# Patient Record
Sex: Male | Born: 1940 | Race: White | Hispanic: No | Marital: Married | Smoking: Current every day smoker
Health system: Southern US, Community
[De-identification: ages and names within clinical notes are randomized; demographics above are authoritative.]

## PROBLEM LIST (undated history)

## (undated) DIAGNOSIS — Z972 Presence of dental prosthetic device (complete) (partial): Secondary | ICD-10-CM

## (undated) DIAGNOSIS — I10 Essential (primary) hypertension: Secondary | ICD-10-CM

## (undated) DIAGNOSIS — K219 Gastro-esophageal reflux disease without esophagitis: Secondary | ICD-10-CM

## (undated) DIAGNOSIS — I509 Heart failure, unspecified: Secondary | ICD-10-CM

## (undated) DIAGNOSIS — I714 Abdominal aortic aneurysm, without rupture, unspecified: Secondary | ICD-10-CM

## (undated) DIAGNOSIS — I739 Peripheral vascular disease, unspecified: Secondary | ICD-10-CM

## (undated) DIAGNOSIS — E785 Hyperlipidemia, unspecified: Secondary | ICD-10-CM

---

## 1992-06-16 HISTORY — PX: CERVICAL FUSION: SHX112

## 2008-06-16 HISTORY — PX: CAROTID ENDARTERECTOMY: SUR193

## 2010-08-28 DIAGNOSIS — I639 Cerebral infarction, unspecified: Secondary | ICD-10-CM

## 2010-08-28 HISTORY — DX: Cerebral infarction, unspecified: I63.9

## 2018-05-16 DIAGNOSIS — I219 Acute myocardial infarction, unspecified: Secondary | ICD-10-CM

## 2018-05-16 HISTORY — DX: Acute myocardial infarction, unspecified: I21.9

## 2018-05-27 HISTORY — PX: CORONARY ARTERY BYPASS GRAFT: SHX141

## 2020-01-26 ENCOUNTER — Other Ambulatory Visit: Payer: Self-pay | Admitting: Orthopedic Surgery

## 2020-01-26 DIAGNOSIS — M5136 Other intervertebral disc degeneration, lumbar region: Secondary | ICD-10-CM

## 2020-01-26 DIAGNOSIS — G8929 Other chronic pain: Secondary | ICD-10-CM

## 2020-02-14 ENCOUNTER — Ambulatory Visit: Payer: Self-pay

## 2020-02-27 ENCOUNTER — Other Ambulatory Visit: Payer: Self-pay

## 2020-02-27 ENCOUNTER — Ambulatory Visit
Admission: RE | Admit: 2020-02-27 | Discharge: 2020-02-27 | Disposition: A | Discharge status: Home / Self Care | Payer: Federal, State, Local not specified - PPO | Source: Ambulatory Visit | Attending: Orthopedic Surgery | Admitting: Orthopedic Surgery

## 2020-02-27 DIAGNOSIS — M5441 Lumbago with sciatica, right side: Secondary | ICD-10-CM | POA: Insufficient documentation

## 2020-02-27 DIAGNOSIS — M5136 Other intervertebral disc degeneration, lumbar region: Secondary | ICD-10-CM | POA: Diagnosis present

## 2020-02-27 DIAGNOSIS — G8929 Other chronic pain: Secondary | ICD-10-CM | POA: Diagnosis present

## 2020-05-29 ENCOUNTER — Encounter: Payer: Self-pay | Admitting: Ophthalmology

## 2020-05-29 ENCOUNTER — Other Ambulatory Visit: Payer: Self-pay

## 2020-05-30 NOTE — Anesthesia Preprocedure Evaluation (Addendum)
Anesthesia Evaluation  Patient identified by MRN, date of birth, ID band Patient awake    Reviewed: Allergy & Precautions, NPO status , Patient's Chart, lab work & pertinent test results, reviewed documented beta blocker date and time   Airway Mallampati: II  TM Distance: >3 FB Neck ROM: Full    Dental  (+) Upper Dentures, Lower Dentures   Pulmonary Current Smoker and Patient abstained from smoking.,    Pulmonary exam normal        Cardiovascular hypertension, Pt. on medications and Pt. on home beta blockers + Past MI (NSTEMI 2019), + CABG (3v 2019, afib post-op but resolved prior to discharge) and + Peripheral Vascular Disease (s/P L CEA)  Normal cardiovascular exam  H/o carotid endarterectomy 2010  H/o of AAA   Neuro/Psych CVA, No Residual Symptoms negative psych ROS   GI/Hepatic Neg liver ROS, GERD  Controlled,  Endo/Other  negative endocrine ROSneg diabetes  Renal/GU negative Renal ROS     Musculoskeletal negative musculoskeletal ROS (+)   Abdominal Normal abdominal exam  (+)   Peds  Hematology negative hematology ROS (+)   Anesthesia Other Findings   Reproductive/Obstetrics                           Anesthesia Physical Anesthesia Plan  ASA: III  Anesthesia Plan: MAC   Post-op Pain Management:    Induction: Intravenous  PONV Risk Score and Plan: 0 and TIVA, Midazolam and Treatment may vary due to age or medical condition  Airway Management Planned: Natural Airway and Nasal Cannula  Additional Equipment:   Intra-op Plan:   Post-operative Plan:   Informed Consent: I have reviewed the patients History and Physical, chart, labs and discussed the procedure including the risks, benefits and alternatives for the proposed anesthesia with the patient or authorized representative who has indicated his/her understanding and acceptance.       Plan Discussed with: CRNA  Anesthesia  Plan Comments:         Anesthesia Quick Evaluation

## 2020-05-31 ENCOUNTER — Other Ambulatory Visit
Admission: RE | Admit: 2020-05-31 | Discharge: 2020-05-31 | Disposition: A | Discharge status: Home / Self Care | Payer: Federal, State, Local not specified - PPO | Source: Ambulatory Visit | Attending: Ophthalmology | Admitting: Ophthalmology

## 2020-05-31 ENCOUNTER — Other Ambulatory Visit: Payer: Self-pay

## 2020-05-31 DIAGNOSIS — Z01812 Encounter for preprocedural laboratory examination: Secondary | ICD-10-CM | POA: Insufficient documentation

## 2020-05-31 DIAGNOSIS — Z20822 Contact with and (suspected) exposure to covid-19: Secondary | ICD-10-CM | POA: Insufficient documentation

## 2020-05-31 LAB — SARS CORONAVIRUS 2 (TAT 6-24 HRS): SARS Coronavirus 2: NEGATIVE

## 2020-05-31 NOTE — Discharge Instructions (Signed)

## 2020-06-04 ENCOUNTER — Other Ambulatory Visit: Payer: Self-pay

## 2020-06-04 ENCOUNTER — Encounter: Payer: Self-pay | Admitting: Ophthalmology

## 2020-06-04 ENCOUNTER — Ambulatory Visit: Payer: Federal, State, Local not specified - PPO | Admitting: Anesthesiology

## 2020-06-04 ENCOUNTER — Ambulatory Visit
Admission: RE | Admit: 2020-06-04 | Discharge: 2020-06-04 | Disposition: A | Discharge status: Home / Self Care | Payer: Federal, State, Local not specified - PPO | Attending: Ophthalmology | Admitting: Ophthalmology

## 2020-06-04 ENCOUNTER — Encounter
Admission: RE | Disposition: A | Discharge status: Home / Self Care | Payer: Self-pay | Source: Home / Self Care | Attending: Ophthalmology

## 2020-06-04 DIAGNOSIS — I252 Old myocardial infarction: Secondary | ICD-10-CM | POA: Insufficient documentation

## 2020-06-04 DIAGNOSIS — Z20822 Contact with and (suspected) exposure to covid-19: Secondary | ICD-10-CM | POA: Insufficient documentation

## 2020-06-04 DIAGNOSIS — I509 Heart failure, unspecified: Secondary | ICD-10-CM | POA: Diagnosis not present

## 2020-06-04 DIAGNOSIS — I110 Hypertensive heart disease with heart failure: Secondary | ICD-10-CM | POA: Insufficient documentation

## 2020-06-04 DIAGNOSIS — H2512 Age-related nuclear cataract, left eye: Secondary | ICD-10-CM | POA: Diagnosis not present

## 2020-06-04 DIAGNOSIS — Z8673 Personal history of transient ischemic attack (TIA), and cerebral infarction without residual deficits: Secondary | ICD-10-CM | POA: Diagnosis not present

## 2020-06-04 DIAGNOSIS — I739 Peripheral vascular disease, unspecified: Secondary | ICD-10-CM | POA: Diagnosis not present

## 2020-06-04 DIAGNOSIS — Z951 Presence of aortocoronary bypass graft: Secondary | ICD-10-CM | POA: Insufficient documentation

## 2020-06-04 DIAGNOSIS — Z7982 Long term (current) use of aspirin: Secondary | ICD-10-CM | POA: Insufficient documentation

## 2020-06-04 DIAGNOSIS — I714 Abdominal aortic aneurysm, without rupture: Secondary | ICD-10-CM | POA: Diagnosis not present

## 2020-06-04 DIAGNOSIS — Z79899 Other long term (current) drug therapy: Secondary | ICD-10-CM | POA: Insufficient documentation

## 2020-06-04 HISTORY — DX: Hyperlipidemia, unspecified: E78.5

## 2020-06-04 HISTORY — DX: Essential (primary) hypertension: I10

## 2020-06-04 HISTORY — DX: Abdominal aortic aneurysm, without rupture: I71.4

## 2020-06-04 HISTORY — DX: Peripheral vascular disease, unspecified: I73.9

## 2020-06-04 HISTORY — DX: Abdominal aortic aneurysm, without rupture, unspecified: I71.40

## 2020-06-04 HISTORY — PX: CATARACT EXTRACTION W/PHACO: SHX586

## 2020-06-04 HISTORY — DX: Presence of dental prosthetic device (complete) (partial): Z97.2

## 2020-06-04 HISTORY — DX: Gastro-esophageal reflux disease without esophagitis: K21.9

## 2020-06-04 HISTORY — DX: Heart failure, unspecified: I50.9

## 2020-06-04 SURGERY — PHACOEMULSIFICATION, CATARACT, WITH IOL INSERTION
Anesthesia: Monitor Anesthesia Care | Site: Eye | Laterality: Left

## 2020-06-04 MED ORDER — SODIUM HYALURONATE 10 MG/ML IO SOLN
INTRAOCULAR | Status: DC | PRN
  Administered 2020-06-04: 0.55 mL via INTRAOCULAR

## 2020-06-04 MED ORDER — MOXIFLOXACIN HCL 0.5 % OP SOLN
OPHTHALMIC | Status: DC | PRN
  Administered 2020-06-04: 0.2 mL via OPHTHALMIC

## 2020-06-04 MED ORDER — MIDAZOLAM HCL 2 MG/2ML IJ SOLN
INTRAMUSCULAR | Status: DC | PRN
  Administered 2020-06-04: 1 mg via INTRAVENOUS

## 2020-06-04 MED ORDER — FENTANYL CITRATE (PF) 100 MCG/2ML IJ SOLN
INTRAMUSCULAR | Status: DC | PRN
  Administered 2020-06-04: 50 ug via INTRAVENOUS

## 2020-06-04 MED ORDER — LACTATED RINGERS IV SOLN: INTRAVENOUS | Status: DC

## 2020-06-04 MED ORDER — ARMC OPHTHALMIC DILATING DROPS
1.0000 "application " | OPHTHALMIC | Status: DC | PRN
  Administered 2020-06-04 (×3): 1 via OPHTHALMIC

## 2020-06-04 MED ORDER — TETRACAINE HCL 0.5 % OP SOLN
1.0000 [drp] | OPHTHALMIC | Status: DC | PRN
  Administered 2020-06-04 (×3): 1 [drp] via OPHTHALMIC

## 2020-06-04 MED ORDER — SODIUM HYALURONATE 23 MG/ML IO SOLN
INTRAOCULAR | Status: DC | PRN
  Administered 2020-06-04: 0.6 mL via INTRAOCULAR

## 2020-06-04 MED ORDER — LIDOCAINE HCL (PF) 2 % IJ SOLN: INTRAOCULAR | Status: DC | PRN

## 2020-06-04 MED ORDER — EPINEPHRINE PF 1 MG/ML IJ SOLN
INTRAOCULAR | Status: DC | PRN
  Administered 2020-06-04: 10:00:00 91 mL via OPHTHALMIC

## 2020-06-04 SURGICAL SUPPLY — 22 items
CANNULA ANT/CHMB 27G (MISCELLANEOUS) ×2 IMPLANT
CANNULA ANT/CHMB 27GA (MISCELLANEOUS) ×6 IMPLANT
DISSECTOR HYDRO NUCLEUS 50X22 (MISCELLANEOUS) ×3 IMPLANT
GLOVE SURG LX 7.5 STRW (GLOVE) ×4
GLOVE SURG LX STRL 7.5 STRW (GLOVE) ×1 IMPLANT
GLOVE SURG SYN 8.5  E (GLOVE) ×2
GLOVE SURG SYN 8.5 E (GLOVE) ×1 IMPLANT
GLOVE SURG SYN 8.5 PF PI (GLOVE) ×1 IMPLANT
GOWN STRL REUS W/ TWL LRG LVL3 (GOWN DISPOSABLE) ×2 IMPLANT
GOWN STRL REUS W/TWL LRG LVL3 (GOWN DISPOSABLE) ×6
LENS IOL IQ PAN TRC 40 25.0 IMPLANT
LENS IOL PANOP TORIC 40 25.0 ×1 IMPLANT
LENS IOL PANOPTIX TORIC 25.0 ×3 IMPLANT
MARKER SKIN DUAL TIP RULER LAB (MISCELLANEOUS) ×3 IMPLANT
PACK DR. KING ARMS (PACKS) ×3 IMPLANT
PACK EYE AFTER SURG (MISCELLANEOUS) ×3 IMPLANT
PACK OPTHALMIC (MISCELLANEOUS) ×3 IMPLANT
SYR 3ML LL SCALE MARK (SYRINGE) ×3 IMPLANT
SYR TB 1ML LUER SLIP (SYRINGE) ×3 IMPLANT
WATER STERILE IRR 250ML POUR (IV SOLUTION) ×3 IMPLANT
WICK EYE OCUCEL (MISCELLANEOUS) ×2 IMPLANT
WIPE NON LINTING 3.25X3.25 (MISCELLANEOUS) ×3 IMPLANT

## 2020-06-04 NOTE — Transfer of Care (Signed)
Immediate Anesthesia Transfer of Care Note  Patient: Rickey Osborne  Procedure(s) Performed: CATARACT EXTRACTION PHACO AND INTRAOCULAR LENS PLACEMENT (IOC) LEFT (Left Eye)  Patient Location: PACU  Anesthesia Type: MAC  Level of Consciousness: awake, alert  and patient cooperative  Airway and Oxygen Therapy: Patient Spontanous Breathing and Patient connected to supplemental oxygen  Post-op Assessment: Post-op Vital signs reviewed, Patient's Cardiovascular Status Stable, Respiratory Function Stable, Patent Airway and No signs of Nausea or vomiting  Post-op Vital Signs: Reviewed and stable  Complications: No complications documented.

## 2020-06-04 NOTE — Anesthesia Postprocedure Evaluation (Signed)
Anesthesia Post Note  Patient: Rickey Osborne  Procedure(s) Performed: CATARACT EXTRACTION PHACO AND INTRAOCULAR LENS PLACEMENT (IOC) LEFT (Left Eye)     Patient location during evaluation: PACU Anesthesia Type: MAC Level of consciousness: awake and alert Pain management: pain level controlled Vital Signs Assessment: post-procedure vital signs reviewed and stable Respiratory status: nonlabored ventilation and spontaneous breathing Cardiovascular status: blood pressure returned to baseline Postop Assessment: no apparent nausea or vomiting Anesthetic complications: no   No complications documented.  Trina Asch [employer]

## 2020-06-04 NOTE — H&P (Signed)
[facility]   Primary Care Physician:  Leim Fabry, MD Ophthalmologist: Dr. Willey Blade  Pre-Procedure History & Physical: HPI:  Rickey Osborne is a 79 y.o. male here for cataract surgery.   Past Medical History:  Diagnosis Date  . AAA (abdominal aortic aneurysm) (HCC)   . CHF (congestive heart failure), NYHA class II (HCC)   . GERD (gastroesophageal reflux disease)   . Hyperlipidemia   . Hypertension   . Myocardial infarction (HCC) 05/2018   NSTEMI  . Peripheral vascular disease (HCC)   . Stroke (HCC) 08/28/2010   No Deficits  . Wears dentures    full upper and lower    Past Surgical History:  Procedure Laterality Date  . CAROTID ENDARTERECTOMY Left 2010  . CERVICAL FUSION  1994   C6-7, C7-8  . 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 Authorizing Provider  amLODipine (NORVASC) 5 MG tablet Take 5 mg by mouth daily.   Yes [provider]  Aspirin 81 MG CAPS Take by mouth daily.   Yes [provider]  atenolol (TENORMIN) 50 MG tablet Take 25 mg by mouth daily.   Yes [provider]  atorvastatin (LIPITOR) 40 MG tablet Take 40 mg by mouth daily.   Yes [provider]  azelastine (OPTIVAR) 0.05 % ophthalmic solution 1 drop 2 (two) times daily.   Yes [provider]  clotrimazole-betamethasone (LOTRISONE) cream Apply 1 application topically 2 (two) times daily.   Yes [provider]  cyanocobalamin 1000 MCG tablet Take 1,000 mcg by mouth every other day.   Yes [provider]  famotidine (PEPCID) 40 MG tablet Take 40 mg by mouth at bedtime.   Yes [provider]  fluocinonide cream (LIDEX) 0.05 % Apply 1 application topically 2 (two) times daily.   Yes [provider]  Multiple Vitamins-Minerals (CENTRUM SILVER 50+MEN) TABS Take by mouth daily.   Yes [provider]   nitroGLYCERIN (NITROSTAT) 0.4 MG SL tablet Place 0.4 mg under the tongue every 5 (five) minutes as needed for chest pain.   Yes [provider]  olopatadine (PATANOL) 0.1 % ophthalmic solution 1 drop 2 (two) times daily.   Yes [provider]  tizanidine (ZANAFLEX) 2 MG capsule Take 2 mg by mouth at bedtime as needed for muscle spasms. Patient not taking: Reported on 06/04/2020    [provider]  traMADol (ULTRAM) 50 MG tablet Take 25-50 mg by mouth 2 (two) times daily as needed. Patient not taking: Reported on 06/04/2020    [provider]    Allergies as of 03/15/2020  . (Not on File)    History reviewed. No pertinent family history.  Social History   Socioeconomic History  . Marital status: Married    Spouse name: Not on file  . Number of children: Not on file  . Years of education: Not on file  . Highest education level: Not on file  Occupational History  . Not on file  Tobacco Use  . Smoking status: Current Every Day Smoker    Packs/day: 0.75    Types: Cigarettes  . Smokeless tobacco: Never Used  Vaping Use  . Vaping Use: Never used  Substance and Sexual Activity  . Alcohol use: Yes    Comment: social  . Drug use: Not on file  . Sexual activity: Not on file  Other Topics Concern  .  Not on file  Social History Narrative  . Not on file   Social Determinants of Health   Financial Resource Strain: Not on file  Food Insecurity: Not on file  Transportation Needs: Not on file  Physical Activity: Not on file  Stress: Not on file  Social Connections: Not on file  Intimate Partner Violence: Not on file    Review of Systems: See HPI, otherwise negative ROS  Physical Exam: BP (!) 158/89   Pulse 81   Temp 98.2 F (36.8 C) (Temporal)   Ht 6' (1.829 m)   Wt 62.1 kg   SpO2 98%   BMI 18.58 kg/m  General:   Alert,  pleasant and cooperative in NAD Head:  Normocephalic and atraumatic. Respiratory:  Normal work of  breathing.  Impression/Plan: [name] is here for cataract surgery.  Risks, benefits, limitations, and alternatives regarding cataract surgery have been reviewed with the patient.  Questions have been answered.  All parties agreeable.   Willey Blade, MD  06/04/2020, 9:15 AM

## 2020-06-04 NOTE — Op Note (Signed)
OPERATIVE NOTE  Rickey Osborne [id number] 06/04/2020   PREOPERATIVE DIAGNOSIS:  Nuclear sclerotic cataract left eye.  H25.12   POSTOPERATIVE DIAGNOSIS:    Nuclear sclerotic cataract left eye.     PROCEDURE:  Phacoemusification with posterior chamber intraocular lens placement of the left eye   LENS:   Implant Name Type Inv. Item Serial No. Manufacturer Lot No. LRB No. Used Action  LENS IOL PANOPTIX TORIC 25.0 - [id number]  LENS IOL PANOPTIX TORIC 25.0 [id number] ALCON  Left 1 Implanted      Procedure(s) with comments: CATARACT EXTRACTION PHACO AND INTRAOCULAR LENS PLACEMENT (IOC) LEFT (Left) - 14.70 1:21.6  TFNT40 +25.0 @171  degrees   ULTRASOUND TIME: 1 minutes 21 seconds.  CDE 14.70   SURGEON:  , MD, MPH   ANESTHESIA:  Topical with tetracaine drops augmented with 1% preservative-free intracameral lidocaine.  ESTIMATED BLOOD LOSS: <1 mL   COMPLICATIONS:  None.   DESCRIPTION OF PROCEDURE:  The patient was identified in the holding room and transported to the operating room and placed in the supine position under the operating microscope.  The left eye was identified as the operative eye and it was prepped and draped in the usual sterile ophthalmic fashion.  The verion was registred.   A 1.0 millimeter clear-corneal paracentesis was made at the 5:00 position. 0.5 ml of preservative-free 1% lidocaine with epinephrine was injected into the anterior chamber.  A drain wick was placed to help mitigate the pooling of fluids.   The anterior chamber was filled with Healon 5 viscoelastic.  A 2.4 millimeter keratome was used to make a near-clear corneal incision at the 2:00 position.  A curvilinear capsulorrhexis was made with a cystotome and capsulorrhexis forceps.  Balanced salt solution was used to hydrodissect and hydrodelineate the nucleus.   Phacoemulsification was then used in stop and chop fashion to remove the lens nucleus and epinucleus.  The remaining  cortex was then removed using the irrigation and aspiration handpiece. Healon was then placed into the capsular bag to distend it for lens placement.  A lens was then injected into the capsular bag.  The remaining viscoelastic was aspirated.  The lens was rotated precisely to 171 with assistance from the verion.   Wounds were hydrated with balanced salt solution.  The anterior chamber was inflated to a physiologic pressure with balanced salt solution.  Intracameral vigamox 0.1 mL undiltued was injected into the eye and a drop placed onto the ocular surface.  No wound leaks were noted.  The patient was taken to the recovery room in stable condition without complications of anesthesia or surgery  Willey Blade 06/04/2020, 9:54 AM

## 2020-06-04 NOTE — Anesthesia Procedure Notes (Signed)
Procedure Name: MAC Date/Time: 06/04/2020 9:22 AM Performed by: Cameron Ali, CRNA Pre-anesthesia Checklist: Patient identified, Emergency Drugs available, Suction available, Timeout performed and Patient being monitored Patient Re-evaluated:Patient Re-evaluated prior to induction Oxygen Delivery Method: Nasal cannula Placement Confirmation: positive ETCO2

## 2020-06-05 ENCOUNTER — Encounter: Payer: Self-pay | Admitting: Ophthalmology

## 2020-06-05 ENCOUNTER — Other Ambulatory Visit: Payer: Self-pay

## 2020-06-07 ENCOUNTER — Other Ambulatory Visit: Payer: Self-pay

## 2020-06-07 ENCOUNTER — Other Ambulatory Visit
Admission: RE | Admit: 2020-06-07 | Discharge: 2020-06-07 | Disposition: A | Discharge status: Home / Self Care | Payer: Federal, State, Local not specified - PPO | Source: Ambulatory Visit | Attending: Ophthalmology | Admitting: Ophthalmology

## 2020-06-07 DIAGNOSIS — Z01812 Encounter for preprocedural laboratory examination: Secondary | ICD-10-CM | POA: Insufficient documentation

## 2020-06-07 DIAGNOSIS — Z20822 Contact with and (suspected) exposure to covid-19: Secondary | ICD-10-CM | POA: Insufficient documentation

## 2020-06-07 DIAGNOSIS — H2512 Age-related nuclear cataract, left eye: Secondary | ICD-10-CM | POA: Diagnosis not present

## 2020-06-07 LAB — SARS CORONAVIRUS 2 (TAT 6-24 HRS): SARS Coronavirus 2: NEGATIVE

## 2020-06-11 ENCOUNTER — Ambulatory Visit: Payer: Federal, State, Local not specified - PPO | Admitting: Anesthesiology

## 2020-06-11 ENCOUNTER — Other Ambulatory Visit: Payer: Self-pay

## 2020-06-11 ENCOUNTER — Encounter
Admission: RE | Disposition: A | Discharge status: Home / Self Care | Payer: Self-pay | Source: Home / Self Care | Attending: Ophthalmology

## 2020-06-11 ENCOUNTER — Ambulatory Visit
Admission: RE | Admit: 2020-06-11 | Discharge: 2020-06-11 | Disposition: A | Discharge status: Home / Self Care | Payer: Federal, State, Local not specified - PPO | Attending: Ophthalmology | Admitting: Ophthalmology

## 2020-06-11 ENCOUNTER — Encounter: Payer: Self-pay | Admitting: Ophthalmology

## 2020-06-11 DIAGNOSIS — Z7982 Long term (current) use of aspirin: Secondary | ICD-10-CM | POA: Diagnosis not present

## 2020-06-11 DIAGNOSIS — F17210 Nicotine dependence, cigarettes, uncomplicated: Secondary | ICD-10-CM | POA: Insufficient documentation

## 2020-06-11 DIAGNOSIS — Z8673 Personal history of transient ischemic attack (TIA), and cerebral infarction without residual deficits: Secondary | ICD-10-CM | POA: Insufficient documentation

## 2020-06-11 DIAGNOSIS — Z951 Presence of aortocoronary bypass graft: Secondary | ICD-10-CM | POA: Insufficient documentation

## 2020-06-11 DIAGNOSIS — Z79899 Other long term (current) drug therapy: Secondary | ICD-10-CM | POA: Diagnosis not present

## 2020-06-11 DIAGNOSIS — H2511 Age-related nuclear cataract, right eye: Secondary | ICD-10-CM | POA: Insufficient documentation

## 2020-06-11 HISTORY — PX: CATARACT EXTRACTION W/PHACO: SHX586

## 2020-06-11 SURGERY — PHACOEMULSIFICATION, CATARACT, WITH IOL INSERTION
Anesthesia: Monitor Anesthesia Care | Site: Eye | Laterality: Right

## 2020-06-11 MED ORDER — LACTATED RINGERS IV SOLN: INTRAVENOUS | Status: DC

## 2020-06-11 MED ORDER — LIDOCAINE HCL (PF) 2 % IJ SOLN
INTRAOCULAR | Status: DC | PRN
  Administered 2020-06-11: 09:00:00 1 mL via INTRAOCULAR

## 2020-06-11 MED ORDER — TETRACAINE HCL 0.5 % OP SOLN
1.0000 [drp] | OPHTHALMIC | Status: DC | PRN
  Administered 2020-06-11 (×3): 1 [drp] via OPHTHALMIC

## 2020-06-11 MED ORDER — SODIUM HYALURONATE 10 MG/ML IO SOLN
INTRAOCULAR | Status: DC | PRN
  Administered 2020-06-11: 0.55 mL via INTRAOCULAR

## 2020-06-11 MED ORDER — MOXIFLOXACIN HCL 0.5 % OP SOLN
OPHTHALMIC | Status: DC | PRN
  Administered 2020-06-11: 0.2 mL via OPHTHALMIC

## 2020-06-11 MED ORDER — MIDAZOLAM HCL 2 MG/2ML IJ SOLN
INTRAMUSCULAR | Status: DC | PRN
  Administered 2020-06-11: 1 mg via INTRAVENOUS

## 2020-06-11 MED ORDER — SODIUM HYALURONATE 23 MG/ML IO SOLN
INTRAOCULAR | Status: DC | PRN
  Administered 2020-06-11: 0.6 mL via INTRAOCULAR

## 2020-06-11 MED ORDER — EPINEPHRINE PF 1 MG/ML IJ SOLN
INTRAOCULAR | Status: DC | PRN
  Administered 2020-06-11: 09:00:00 81 mL via OPHTHALMIC

## 2020-06-11 MED ORDER — FENTANYL CITRATE (PF) 100 MCG/2ML IJ SOLN
INTRAMUSCULAR | Status: DC | PRN
  Administered 2020-06-11: 50 ug via INTRAVENOUS

## 2020-06-11 MED ORDER — ARMC OPHTHALMIC DILATING DROPS
1.0000 "application " | OPHTHALMIC | Status: DC | PRN
  Administered 2020-06-11 (×3): 1 via OPHTHALMIC

## 2020-06-11 SURGICAL SUPPLY — 21 items
CANNULA ANT/CHMB 27G (MISCELLANEOUS) ×2 IMPLANT
CANNULA ANT/CHMB 27GA (MISCELLANEOUS) ×6 IMPLANT
DISSECTOR HYDRO NUCLEUS 50X22 (MISCELLANEOUS) ×3 IMPLANT
GLOVE SURG LX 7.5 STRW (GLOVE) ×4
GLOVE SURG LX STRL 7.5 STRW (GLOVE) ×1 IMPLANT
GLOVE SURG SYN 8.5  E (GLOVE) ×2
GLOVE SURG SYN 8.5 E (GLOVE) ×1 IMPLANT
GLOVE SURG SYN 8.5 PF PI (GLOVE) ×1 IMPLANT
GOWN STRL REUS W/ TWL LRG LVL3 (GOWN DISPOSABLE) ×2 IMPLANT
GOWN STRL REUS W/TWL LRG LVL3 (GOWN DISPOSABLE) ×6
LENS IOL IQ PAN TRC 50 24.5 IMPLANT
LENS IOL PANOP TORIC 50 24.5 ×1 IMPLANT
LENS IOL PANOPTIX TORIC 24.5 ×3 IMPLANT
MARKER SKIN DUAL TIP RULER LAB (MISCELLANEOUS) ×3 IMPLANT
PACK DR. KING ARMS (PACKS) ×3 IMPLANT
PACK EYE AFTER SURG (MISCELLANEOUS) ×3 IMPLANT
PACK OPTHALMIC (MISCELLANEOUS) ×3 IMPLANT
SYR 3ML LL SCALE MARK (SYRINGE) ×3 IMPLANT
SYR TB 1ML LUER SLIP (SYRINGE) ×3 IMPLANT
WATER STERILE IRR 250ML POUR (IV SOLUTION) ×3 IMPLANT
WIPE NON LINTING 3.25X3.25 (MISCELLANEOUS) ×3 IMPLANT

## 2020-06-11 NOTE — Anesthesia Preprocedure Evaluation (Addendum)
Anesthesia Evaluation  Patient identified by MRN, date of birth, ID band Patient awake    Reviewed: Allergy & Precautions, NPO status , Patient's Chart, lab work & pertinent test results, reviewed documented beta blocker date and time   Airway Mallampati: II  TM Distance: >3 FB Neck ROM: Full    Dental  (+) Upper Dentures, Lower Dentures   Pulmonary Current Smoker (1 ppd) and Patient abstained from smoking.,    Pulmonary exam normal        Cardiovascular hypertension, Pt. on medications and Pt. on home beta blockers + Past MI (NSTEMI 2019), + CABG (3v 2019, afib post-op but resolved prior to discharge) and + Peripheral Vascular Disease (s/P L CEA)  Normal cardiovascular exam  H/o carotid endarterectomy 2010  H/o of AAA   Neuro/Psych CVA (2013), No Residual Symptoms negative psych ROS   GI/Hepatic Neg liver ROS, GERD  Controlled,  Endo/Other  negative endocrine ROSneg diabetes  Renal/GU negative Renal ROS     Musculoskeletal negative musculoskeletal ROS (+)   Abdominal Normal abdominal exam  (+)   Peds  Hematology negative hematology ROS (+)   Anesthesia Other Findings   Reproductive/Obstetrics                           Anesthesia Physical  Anesthesia Plan  ASA: III  Anesthesia Plan: MAC   Post-op Pain Management:    Induction: Intravenous  PONV Risk Score and Plan: 0 and TIVA, Midazolam and Treatment may vary due to age or medical condition  Airway Management Planned: Natural Airway and Nasal Cannula  Additional Equipment: None  Intra-op Plan:   Post-operative Plan:   Informed Consent: I have reviewed the patients History and Physical, chart, labs and discussed the procedure including the risks, benefits and alternatives for the proposed anesthesia with the patient or authorized representative who has indicated his/her understanding and acceptance.       Plan Discussed  with: CRNA  Anesthesia Plan Comments:        Anesthesia Quick Evaluation

## 2020-06-11 NOTE — Op Note (Signed)
OPERATIVE NOTE  Rickey Osborne [id number] 06/11/2020   PREOPERATIVE DIAGNOSIS:  Nuclear sclerotic cataract right eye.  H25.11   POSTOPERATIVE DIAGNOSIS:    Nuclear sclerotic cataract right eye.     PROCEDURE:  Phacoemusification with posterior chamber intraocular lens placement of the right eye   LENS:   Implant Name Type Inv. Item Serial No. Manufacturer Lot No. LRB No. Used Action  LENS IOL PANOPTIX TORIC 24.5 - [id number]  LENS IOL PANOPTIX TORIC 24.5 [id number] ALCON  Right 1 Implanted       Procedure(s) with comments: CATARACT EXTRACTION PHACO AND INTRAOCULAR LENS PLACEMENT (IOC) RIGHT PANOPTIX TORIC (Right) - 12.93 1:05.3  TFNT50 +24.5 @ 006 degrees   ULTRASOUND TIME: 1 minutes 05 seconds.  CDE 12.93   SURGEON:  Willey Blade, MD, MPH  ANESTHESIOLOGIST: Anesthesiologist: Page, Wille Celeste, MD CRNA: Maree Krabbe, CRNA   ANESTHESIA:  Topical with tetracaine drops augmented with 1% preservative-free intracameral lidocaine.  ESTIMATED BLOOD LOSS: less than 1 mL.   COMPLICATIONS:  None.   DESCRIPTION OF PROCEDURE:  The patient was identified in the holding room and transported to the operating room and placed in the supine position under the operating microscope.  The right eye was identified as the operative eye and it was prepped and draped in the usual sterile ophthalmic fashion.  The verion system was registered without difficulty.   A 1.0 millimeter clear-corneal paracentesis was made at the 10:30 position. 0.5 ml of preservative-free 1% lidocaine with epinephrine was injected into the anterior chamber.  The anterior chamber was filled with Healon 5 viscoelastic.  A 2.4 millimeter keratome was used to make a near-clear corneal incision at the 8:00 position.  A curvilinear capsulorrhexis was made with a cystotome and capsulorrhexis forceps.  Balanced salt solution was used to hydrodissect and hydrodelineate the nucleus.   Phacoemulsification was then used in  stop and chop fashion to remove the lens nucleus and epinucleus.  The remaining cortex was then removed using the irrigation and aspiration handpiece. Healon was then placed into the capsular bag to distend it for lens placement.  A lens was then injected into the capsular bag.  The remaining viscoelastic was aspirated.  The lens was rotated to alignment guided by the verion to 006 degrees.   Wounds were hydrated with balanced salt solution.  The anterior chamber was inflated to a physiologic pressure with balanced salt solution.    Intracameral vigamox 0.1 mL undiluted was injected into the eye and a drop placed onto the ocular surface.  No wound leaks were noted.  The patient was taken to the recovery room in stable condition without complications of anesthesia or surgery  Willey Blade 06/11/2020, 9:17 AM

## 2020-06-11 NOTE — Transfer of Care (Signed)
Immediate Anesthesia Transfer of Care Note  Patient: Rickey Osborne  Procedure(s) Performed: CATARACT EXTRACTION PHACO AND INTRAOCULAR LENS PLACEMENT (IOC) RIGHT PANOPTIX TORIC (Right Eye)  Patient Location: PACU  Anesthesia Type: MAC  Level of Consciousness: awake, alert  and patient cooperative  Airway and Oxygen Therapy: Patient Spontanous Breathing and Patient connected to supplemental oxygen  Post-op Assessment: Post-op Vital signs reviewed, Patient's Cardiovascular Status Stable, Respiratory Function Stable, Patent Airway and No signs of Nausea or vomiting  Post-op Vital Signs: Reviewed and stable  Complications: No complications documented.

## 2020-06-11 NOTE — H&P (Signed)
[facility]   Primary Care Physician:  Leim Fabry, MD Ophthalmologist: Dr. Willey Blade  Pre-Procedure History & Physical: HPI:  Rickey Osborne is a 79 y.o. male here for cataract surgery.   Past Medical History:  Diagnosis Date  . AAA (abdominal aortic aneurysm) (HCC)   . CHF (congestive heart failure), NYHA class II (HCC)   . GERD (gastroesophageal reflux disease)   . Hyperlipidemia   . Hypertension   . Myocardial infarction (HCC) 05/2018   NSTEMI  . Peripheral vascular disease (HCC)   . Stroke (HCC) 08/28/2010   No Deficits  . Wears dentures    full upper and lower    Past Surgical History:  Procedure Laterality Date  . CAROTID ENDARTERECTOMY Left 2010  . CATARACT EXTRACTION W/PHACO Left 06/04/2020   Procedure: CATARACT EXTRACTION PHACO AND INTRAOCULAR LENS PLACEMENT (IOC) LEFT;  Surgeon: Nevada Crane, MD;  Location: [facility];  Service: Ophthalmology;  Laterality: Left;  14.70 1:21.6  . CERVICAL FUSION  1994   C6-7, C7-8  . 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 Authorizing Provider  amLODipine (NORVASC) 5 MG tablet Take 5 mg by mouth daily.   Yes [provider]  Aspirin 81 MG CAPS Take by mouth daily.   Yes [provider]  atenolol (TENORMIN) 50 MG tablet Take 25 mg by mouth daily.   Yes [provider]  azelastine (OPTIVAR) 0.05 % ophthalmic solution 1 drop 2 (two) times daily.   Yes [provider]  clotrimazole-betamethasone (LOTRISONE) cream Apply 1 application topically 2 (two) times daily.   Yes [provider]  cyanocobalamin 1000 MCG tablet Take 1,000 mcg by mouth every other day.   Yes [provider]  famotidine (PEPCID) 40 MG tablet Take 40 mg by mouth at bedtime.   Yes [provider]  fluocinonide cream (LIDEX) 0.05 % Apply 1 application topically 2 (two)  times daily.   Yes [provider]  Multiple Vitamins-Minerals (CENTRUM SILVER 50+MEN) TABS Take by mouth daily.   Yes [provider]  olopatadine (PATANOL) 0.1 % ophthalmic solution 1 drop 2 (two) times daily.   Yes [provider]  atorvastatin (LIPITOR) 40 MG tablet Take 40 mg by mouth daily.    [provider]  nitroGLYCERIN (NITROSTAT) 0.4 MG SL tablet Place 0.4 mg under the tongue every 5 (five) minutes as needed for chest pain.    [provider]  tizanidine (ZANAFLEX) 2 MG capsule Take 2 mg by mouth at bedtime as needed for muscle spasms. Patient not taking: Reported on 06/04/2020    [provider]  traMADol (ULTRAM) 50 MG tablet Take 25-50 mg by mouth 2 (two) times daily as needed. Patient not taking: Reported on 06/04/2020    [provider]    Allergies as of 06/05/2020 - Review Complete 06/05/2020  Allergen Reaction Noted  . Naprosyn [naproxen] Itching 05/29/2020    History reviewed. No pertinent family history.  Social History   Socioeconomic History  . Marital status: Married    Spouse name: Not on file  . Number of children: Not on file  . Years of education: Not on file  . Highest education level: Not on file  Occupational History  . Not on file  Tobacco Use  . Smoking status: Current Every Day Smoker    Packs/day: 0.75    Types: Cigarettes  .  Smokeless tobacco: Never Used  Vaping Use  . Vaping Use: Never used  Substance and Sexual Activity  . Alcohol use: Yes    Comment: social  . Drug use: Not on file  . Sexual activity: Not on file  Other Topics Concern  . Not on file  Social History Narrative  . Not on file   Social Determinants of Health   Financial Resource Strain: Not on file  Food Insecurity: Not on file  Transportation Needs: Not on file  Physical Activity: Not on file  Stress: Not on file  Social Connections: Not on file  Intimate Partner Violence: Not on file    Review  of Systems: See HPI, otherwise negative ROS  Physical Exam: BP 140/72   Pulse 64   Temp (!) 97 F (36.1 C) (Temporal)   Resp 18   Ht 6' (1.829 m)   Wt 62.1 kg   SpO2 99%   BMI 18.58 kg/m  General:   Alert,  pleasant and cooperative in NAD Head:  Normocephalic and atraumatic. Respiratory:  Normal work of breathing.  Impression/Plan: Rickey Osborne is here for cataract surgery.  Risks, benefits, limitations, and alternatives regarding cataract surgery have been reviewed with the patient.  Questions have been answered.  All parties agreeable.   Willey Blade, MD  06/11/2020, 8:40 AM

## 2020-06-11 NOTE — Anesthesia Procedure Notes (Signed)
Procedure Name: MAC Date/Time: 06/11/2020 8:49 AM Performed by: Cameron Ali, CRNA Pre-anesthesia Checklist: Patient identified, Emergency Drugs available, Suction available, Timeout performed and Patient being monitored Patient Re-evaluated:Patient Re-evaluated prior to induction Oxygen Delivery Method: Nasal cannula Placement Confirmation: positive ETCO2

## 2020-06-11 NOTE — Anesthesia Postprocedure Evaluation (Signed)
Anesthesia Post Note  Patient: Rickey Osborne  Procedure(s) Performed: CATARACT EXTRACTION PHACO AND INTRAOCULAR LENS PLACEMENT (IOC) RIGHT PANOPTIX TORIC (Right Eye)     Patient location during evaluation: PACU Anesthesia Type: MAC Level of consciousness: awake and alert Pain management: pain level controlled Vital Signs Assessment: post-procedure vital signs reviewed and stable Respiratory status: spontaneous breathing Cardiovascular status: blood pressure returned to baseline Postop Assessment: no apparent nausea or vomiting, adequate PO intake and no headache Anesthetic complications: no   No complications documented.  Adele Barthel Gerald Honea

## 2020-07-10 ENCOUNTER — Other Ambulatory Visit: Payer: Self-pay

## 2020-07-10 ENCOUNTER — Encounter: Payer: Self-pay | Admitting: Ophthalmology

## 2020-07-12 ENCOUNTER — Other Ambulatory Visit
Admission: RE | Admit: 2020-07-12 | Discharge: 2020-07-12 | Disposition: A | Discharge status: Home / Self Care | Payer: Federal, State, Local not specified - PPO | Source: Ambulatory Visit | Attending: Ophthalmology | Admitting: Ophthalmology

## 2020-07-12 ENCOUNTER — Other Ambulatory Visit: Payer: Self-pay

## 2020-07-12 DIAGNOSIS — Z01812 Encounter for preprocedural laboratory examination: Secondary | ICD-10-CM | POA: Diagnosis not present

## 2020-07-12 DIAGNOSIS — Z20822 Contact with and (suspected) exposure to covid-19: Secondary | ICD-10-CM | POA: Insufficient documentation

## 2020-07-12 NOTE — Discharge Instructions (Signed)

## 2020-07-13 LAB — SARS CORONAVIRUS 2 (TAT 6-24 HRS): SARS Coronavirus 2: NEGATIVE

## 2020-07-16 ENCOUNTER — Ambulatory Visit
Admission: RE | Admit: 2020-07-16 | Discharge: 2020-07-16 | Disposition: A | Discharge status: Home / Self Care | Payer: Federal, State, Local not specified - PPO | Attending: Ophthalmology | Admitting: Ophthalmology

## 2020-07-16 ENCOUNTER — Ambulatory Visit: Payer: Federal, State, Local not specified - PPO | Admitting: Anesthesiology

## 2020-07-16 ENCOUNTER — Encounter
Admission: RE | Disposition: A | Discharge status: Home / Self Care | Payer: Self-pay | Source: Home / Self Care | Attending: Ophthalmology

## 2020-07-16 ENCOUNTER — Other Ambulatory Visit: Payer: Self-pay

## 2020-07-16 ENCOUNTER — Encounter: Payer: Self-pay | Admitting: Ophthalmology

## 2020-07-16 DIAGNOSIS — Z7982 Long term (current) use of aspirin: Secondary | ICD-10-CM | POA: Diagnosis not present

## 2020-07-16 DIAGNOSIS — Z79899 Other long term (current) drug therapy: Secondary | ICD-10-CM | POA: Insufficient documentation

## 2020-07-16 DIAGNOSIS — T8522XA Displacement of intraocular lens, initial encounter: Secondary | ICD-10-CM | POA: Diagnosis not present

## 2020-07-16 DIAGNOSIS — F17210 Nicotine dependence, cigarettes, uncomplicated: Secondary | ICD-10-CM | POA: Diagnosis not present

## 2020-07-16 DIAGNOSIS — Z791 Long term (current) use of non-steroidal anti-inflammatories (NSAID): Secondary | ICD-10-CM | POA: Diagnosis not present

## 2020-07-16 DIAGNOSIS — Z8673 Personal history of transient ischemic attack (TIA), and cerebral infarction without residual deficits: Secondary | ICD-10-CM | POA: Insufficient documentation

## 2020-07-16 DIAGNOSIS — Z951 Presence of aortocoronary bypass graft: Secondary | ICD-10-CM | POA: Insufficient documentation

## 2020-07-16 DIAGNOSIS — Y831 Surgical operation with implant of artificial internal device as the cause of abnormal reaction of the patient, or of later complication, without mention of misadventure at the time of the procedure: Secondary | ICD-10-CM | POA: Insufficient documentation

## 2020-07-16 HISTORY — PX: CATARACT EXTRACTION W/PHACO: SHX586

## 2020-07-16 SURGERY — PHACOEMULSIFICATION, CATARACT, WITH IOL INSERTION
Anesthesia: Monitor Anesthesia Care | Site: Eye | Laterality: Right

## 2020-07-16 MED ORDER — ARMC OPHTHALMIC DILATING DROPS
1.0000 "application " | OPHTHALMIC | Status: DC | PRN
  Administered 2020-07-16 (×3): 1 via OPHTHALMIC

## 2020-07-16 MED ORDER — EPINEPHRINE PF 1 MG/ML IJ SOLN
INTRAOCULAR | Status: DC | PRN
  Administered 2020-07-16: 18 mL via OPHTHALMIC

## 2020-07-16 MED ORDER — FENTANYL CITRATE (PF) 100 MCG/2ML IJ SOLN
INTRAMUSCULAR | Status: DC | PRN
  Administered 2020-07-16: 50 ug via INTRAVENOUS

## 2020-07-16 MED ORDER — ACETAMINOPHEN 325 MG PO TABS: 325.0000 mg | ORAL_TABLET | Freq: Once | ORAL | Status: DC

## 2020-07-16 MED ORDER — TETRACAINE HCL 0.5 % OP SOLN
1.0000 [drp] | OPHTHALMIC | Status: DC | PRN
  Administered 2020-07-16 (×3): 1 [drp] via OPHTHALMIC

## 2020-07-16 MED ORDER — MOXIFLOXACIN HCL 0.5 % OP SOLN
OPHTHALMIC | Status: DC | PRN
  Administered 2020-07-16: 0.2 mL via OPHTHALMIC

## 2020-07-16 MED ORDER — MIDAZOLAM HCL 2 MG/2ML IJ SOLN
INTRAMUSCULAR | Status: DC | PRN
  Administered 2020-07-16: 1 mg via INTRAVENOUS

## 2020-07-16 MED ORDER — ACETAMINOPHEN 160 MG/5ML PO SOLN: 325.0000 mg | Freq: Once | ORAL | Status: DC

## 2020-07-16 MED ORDER — SODIUM HYALURONATE 23 MG/ML IO SOLN
INTRAOCULAR | Status: DC | PRN
  Administered 2020-07-16: 0.6 mL via INTRAOCULAR

## 2020-07-16 MED ORDER — LACTATED RINGERS IV SOLN: INTRAVENOUS | Status: DC

## 2020-07-16 MED ORDER — LIDOCAINE HCL (PF) 2 % IJ SOLN
INTRAOCULAR | Status: DC | PRN
  Administered 2020-07-16: 4 mL via INTRAOCULAR

## 2020-07-16 SURGICAL SUPPLY — 16 items
CANNULA ANT/CHMB 27GA (MISCELLANEOUS) ×4 IMPLANT
DISSECTOR HYDRO NUCLEUS 50X22 (MISCELLANEOUS) ×2 IMPLANT
GLOVE SURG LX 7.5 STRW (GLOVE) ×1
GLOVE SURG LX STRL 7.5 STRW (GLOVE) ×1 IMPLANT
GLOVE SURG SYN 8.5  E (GLOVE) ×1
GLOVE SURG SYN 8.5 E (GLOVE) ×1 IMPLANT
GOWN STRL REUS W/ TWL LRG LVL3 (GOWN DISPOSABLE) ×2 IMPLANT
GOWN STRL REUS W/TWL LRG LVL3 (GOWN DISPOSABLE) ×4
MARKER SKIN DUAL TIP RULER LAB (MISCELLANEOUS) ×2 IMPLANT
PACK DR. KING ARMS (PACKS) ×2 IMPLANT
PACK EYE AFTER SURG (MISCELLANEOUS) ×2 IMPLANT
PACK OPTHALMIC (MISCELLANEOUS) ×2 IMPLANT
SYR 3ML LL SCALE MARK (SYRINGE) ×2 IMPLANT
SYR TB 1ML LUER SLIP (SYRINGE) ×2 IMPLANT
WATER STERILE IRR 250ML POUR (IV SOLUTION) ×2 IMPLANT
WIPE NON LINTING 3.25X3.25 (MISCELLANEOUS) ×2 IMPLANT

## 2020-07-16 NOTE — Anesthesia Preprocedure Evaluation (Signed)
Anesthesia Evaluation  Patient identified by MRN, date of birth, ID band Patient awake    Reviewed: Allergy & Precautions, NPO status , Patient's Chart, lab work & pertinent test results, reviewed documented beta blocker date and time   Airway Mallampati: II  TM Distance: >3 FB Neck ROM: Full    Dental  (+) Upper Dentures, Lower Dentures   Pulmonary Current Smoker and Patient abstained from smoking.,    Pulmonary exam normal        Cardiovascular hypertension, Pt. on medications and Pt. on home beta blockers + Past MI (NSTEMI 2019), + CABG (3v 2019, afib post-op but resolved prior to discharge) and + Peripheral Vascular Disease (s/P L CEA)  Normal cardiovascular exam  H/o carotid endarterectomy 2010  H/o of AAA   Neuro/Psych CVA (2013), No Residual Symptoms negative psych ROS   GI/Hepatic Neg liver ROS, GERD  Controlled,  Endo/Other  negative endocrine ROSneg diabetes  Renal/GU negative Renal ROS     Musculoskeletal negative musculoskeletal ROS (+)   Abdominal Normal abdominal exam  (+)   Peds  Hematology negative hematology ROS (+)   Anesthesia Other Findings   Reproductive/Obstetrics                             Anesthesia Physical  Anesthesia Plan  ASA: III  Anesthesia Plan: MAC   Post-op Pain Management:    Induction: Intravenous  PONV Risk Score and Plan: 0 and TIVA, Midazolam and Treatment may vary due to age or medical condition  Airway Management Planned: Natural Airway and Nasal Cannula  Additional Equipment: None  Intra-op Plan:   Post-operative Plan:   Informed Consent: I have reviewed the patients History and Physical, chart, labs and discussed the procedure including the risks, benefits and alternatives for the proposed anesthesia with the patient or authorized representative who has indicated his/her understanding and acceptance.       Plan Discussed with:  CRNA  Anesthesia Plan Comments:         Anesthesia Quick Evaluation

## 2020-07-16 NOTE — Op Note (Signed)
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    PROCEDURE:  CPT [id number] repositioning of intraocular lens, right eye.   LENS:  existing lens  Procedure(s): REPOSITIONING OF LENS RIGHT (Right)    ULTRASOUND TIME: 0 minutes 0 seconds.  CDE 0   SURGEON:  Willey Blade, MD, MPH  ANESTHESIOLOGIST: Anesthesiologist: Ranee Gosselin, MD CRNA: Michaele Offer, CRNA   ANESTHESIA:  Topical with tetracaine drops augmented with 1% preservative-free intracameral lidocaine.  ESTIMATED BLOOD LOSS: less than 1 mL.   COMPLICATIONS:  None.   DESCRIPTION OF PROCEDURE:  The patient was identified in the holding room and transported to the operating room and placed in the supine position under the operating microscope.  The right eye was identified as the operative eye and it was prepped and draped in the usual sterile ophthalmic fashion.  The verion system was registered without difficulty.   The existing recent wounds were reopened with a sinsky hook--1.0 millimeter clear-corneal paracentesis at the 10:30 position. 0.5 ml of preservative-free 1% lidocaine with epinephrine was injected into the anterior chamber. The 2.4 millimeter keratome was reopened at the 8:00 position.   The I/A was used to maintain the anterior chamber while the lens was dissected easily from the capsule with the sinksey and then rotated.  To better stabilize the eye and allow for bimanual rotation, Healon 5 was injected into the [facility] only (not behind the lens).  The lens was rotated with guidance from the verion system to 17 degrees.   Wounds were hydrated with balanced salt solution.  The anterior chamber was inflated to a physiologic pressure/slightly low with balanced salt solution. The lens was well centered and aligned.  Photodocumentation with the verion was performed.  Intracameral vigamox 0.1 mL  undiluted was injected into the eye and a drop placed onto the ocular surface.  No wound leaks were noted.  The patient was taken to the recovery room in stable condition without complications of anesthesia or surgery  Willey Blade 07/16/2020, 10:05 AM

## 2020-07-16 NOTE — Anesthesia Procedure Notes (Signed)
Procedure Name: MAC Date/Time: 07/16/2020 9:42 AM Performed by: Silvana Newness, CRNA Pre-anesthesia Checklist: Patient identified, Emergency Drugs available, Suction available, Patient being monitored and Timeout performed Patient Re-evaluated:Patient Re-evaluated prior to induction Oxygen Delivery Method: Nasal cannula Placement Confirmation: positive ETCO2

## 2020-07-16 NOTE — Anesthesia Postprocedure Evaluation (Signed)
Anesthesia Post Note  Patient: Rickey Osborne  Procedure(s) Performed: REPOSITIONING OF LENS RIGHT (Right Eye)     Patient location during evaluation: PACU Anesthesia Type: MAC Level of consciousness: awake and alert and oriented Pain management: satisfactory to patient Vital Signs Assessment: post-procedure vital signs reviewed and stable Respiratory status: spontaneous breathing, nonlabored ventilation and respiratory function stable Cardiovascular status: blood pressure returned to baseline and stable Postop Assessment: Adequate PO intake and No signs of nausea or vomiting Anesthetic complications: no   No complications documented.  Raliegh Ip

## 2020-07-16 NOTE — Transfer of Care (Signed)
Immediate Anesthesia Transfer of Care Note  Patient: Rickey Osborne  Procedure(s) Performed: REPOSITIONING OF LENS RIGHT (Right Eye)  Patient Location: PACU  Anesthesia Type: MAC  Level of Consciousness: awake, alert  and patient cooperative  Airway and Oxygen Therapy: Patient Spontanous Breathing and Patient connected to supplemental oxygen  Post-op Assessment: Post-op Vital signs reviewed, Patient's Cardiovascular Status Stable, Respiratory Function Stable, Patent Airway and No signs of Nausea or vomiting  Post-op Vital Signs: Reviewed and stable  Complications: No complications documented.

## 2020-07-16 NOTE — H&P (Signed)
[facility]   Primary Care Physician:  Leim Fabry, MD Ophthalmologist: Dr. Willey Blade  Pre-Procedure History & Physical: HPI:  Rickey Osborne is a 80 y.o. male here for rotation/repositioning of toric intraocular lens.   Past Medical History:  Diagnosis Date  . AAA (abdominal aortic aneurysm) (HCC)   . CHF (congestive heart failure), NYHA class II (HCC)   . GERD (gastroesophageal reflux disease)   . Hyperlipidemia   . Hypertension   . Myocardial infarction (HCC) 05/2018   NSTEMI  . Peripheral vascular disease (HCC)   . Stroke (HCC) 08/28/2010   No Deficits  . Wears dentures    full upper and lower    Past Surgical History:  Procedure Laterality Date  . CAROTID ENDARTERECTOMY Left 2010  . CATARACT EXTRACTION W/PHACO Left 06/04/2020   Procedure: CATARACT EXTRACTION PHACO AND INTRAOCULAR LENS PLACEMENT (IOC) LEFT;  Surgeon: Nevada Crane, MD;  Location: [facility];  Service: Ophthalmology;  Laterality: Left;  14.70 1:21.6  . CATARACT EXTRACTION W/PHACO Right 06/11/2020   Procedure: CATARACT EXTRACTION PHACO AND INTRAOCULAR LENS PLACEMENT (IOC) RIGHT PANOPTIX TORIC;  Surgeon: Nevada Crane, MD;  Location: [facility];  Service: Ophthalmology;  Laterality: Right;  12.93 1:05.3  . CERVICAL FUSION  1994   C6-7, C7-8  . 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 Authorizing Provider  amLODipine (NORVASC) 5 MG tablet Take 5 mg by mouth daily.   Yes [provider]  Aspirin 81 MG CAPS Take by mouth daily.   Yes [provider]  atenolol (TENORMIN) 50 MG tablet Take 25 mg by mouth daily.   Yes [provider]  atorvastatin (LIPITOR) 40 MG tablet Take 40 mg by mouth daily.   Yes [provider]  azelastine (OPTIVAR) 0.05 % ophthalmic solution 1 drop 2 (two) times daily.   Yes [provider]   clotrimazole-betamethasone (LOTRISONE) cream Apply 1 application topically 2 (two) times daily.   Yes [provider]  cyanocobalamin 1000 MCG tablet Take 1,000 mcg by mouth every other day.   Yes [provider]  famotidine (PEPCID) 40 MG tablet Take 40 mg by mouth at bedtime.   Yes [provider]  fluocinonide cream (LIDEX) 0.05 % Apply 1 application topically 2 (two) times daily.   Yes [provider]  Multiple Vitamins-Minerals (CENTRUM SILVER 50+MEN) TABS Take by mouth daily.   Yes [provider]  nitroGLYCERIN (NITROSTAT) 0.4 MG SL tablet Place 0.4 mg under the tongue every 5 (five) minutes as needed for chest pain.    [provider]  olopatadine (PATANOL) 0.1 % ophthalmic solution 1 drop 2 (two) times daily.    [provider]  tizanidine (ZANAFLEX) 2 MG capsule Take 2 mg by mouth at bedtime as needed for muscle spasms. Patient not taking: Reported on 06/04/2020    [provider]  traMADol (ULTRAM) 50 MG tablet Take 25-50 mg by mouth 2 (two) times daily as needed. Patient not taking: Reported on 06/04/2020    [provider]    Allergies as of 07/05/2020 - Review Complete 06/11/2020  Allergen Reaction Noted  . Naprosyn [naproxen] Itching 05/29/2020    History reviewed. No pertinent family history.  Social History   Socioeconomic History  . Marital status: Married    Spouse name: Not on file  . Number of children: Not on file  .  Years of education: Not on file  . Highest education level: Not on file  Occupational History  . Not on file  Tobacco Use  . Smoking status: Current Every Day Smoker    Packs/day: 0.75    Types: Cigarettes  . Smokeless tobacco: Never Used  Vaping Use  . Vaping Use: Never used  Substance and Sexual Activity  . Alcohol use: Yes    Comment: social  . Drug use: Not on file  . Sexual activity: Not on file  Other Topics Concern  . Not on file  Social History  Narrative  . Not on file   Social Determinants of Health   Financial Resource Strain: Not on file  Food Insecurity: Not on file  Transportation Needs: Not on file  Physical Activity: Not on file  Stress: Not on file  Social Connections: Not on file  Intimate Partner Violence: Not on file    Review of Systems: See HPI, otherwise negative ROS  Physical Exam: BP 137/67   Pulse 67   Temp (!) 97 F (36.1 C) (Temporal)   Resp 18   Ht 6' (1.829 m)   Wt 63 kg   SpO2 99%   BMI 18.85 kg/m  General:   Alert,  pleasant and cooperative in NAD Head:  Normocephalic and atraumatic. Respiratory:  Normal work of breathing.  Impression/Plan: Rickey Osborne is here for repositioning/rotation of toric intraocular lens surgery.  Risks, benefits, limitations, and alternatives regarding cataract surgery have been reviewed with the patient.  Questions have been answered.  All parties agreeable.   Willey Blade, MD  07/16/2020, 9:25 AM

## 2021-03-20 IMAGING — MR MR LUMBAR SPINE W/O CM
5 series · 30 of 48 positions shown · non-contrast
Comparison: None.

CLINICAL DATA: Chronic low back pain radiating down the right leg.

EXAM:
MRI LUMBAR SPINE WITHOUT CONTRAST
TECHNIQUE: Multiplanar, multisequence MR imaging of the lumbar spine was
performed. No intravenous contrast was administered.

[Series 5: T2 · sagittal · 4.0mm · 0.81mm/px · 6 of 17 slices shown (1 of 2)]
[im 1/17]
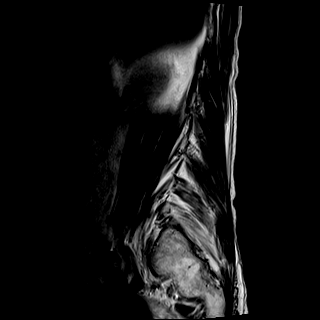
[im 4/17]
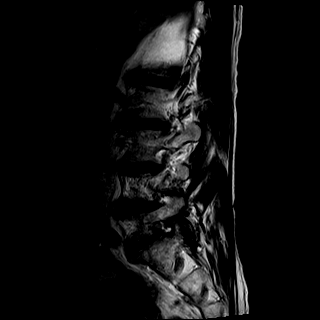
[im 7/17]
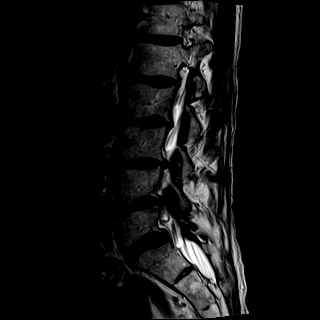
[im 10/17]
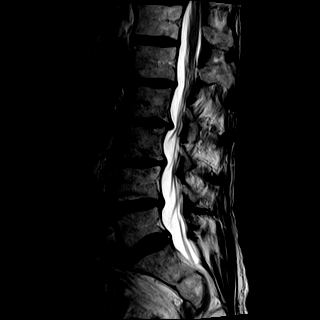
[im 13/17]
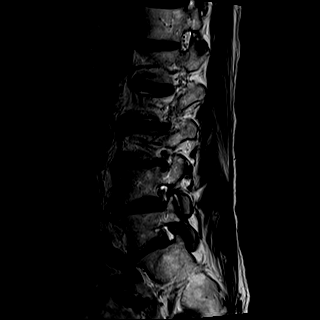
[im 17/17]
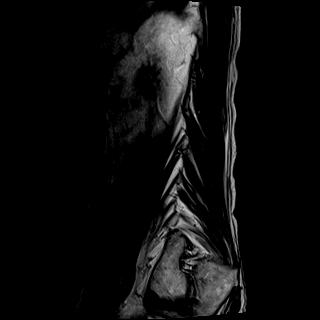

[Series 6: T1 · sagittal · 4.0mm · 0.81mm/px · 7 of 17 slices shown (1 of 2)]
[im 1/17]
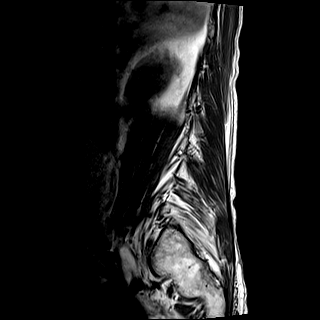
[im 3/17]
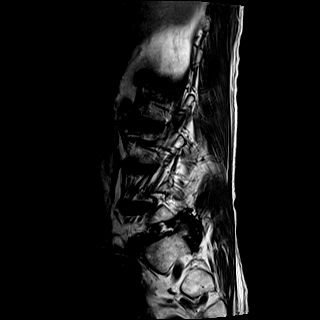
[im 6/17]
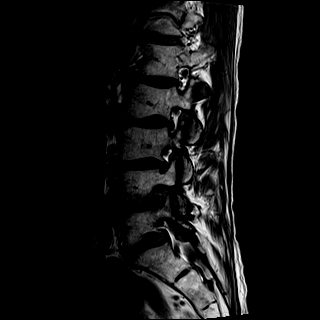
[im 9/17]
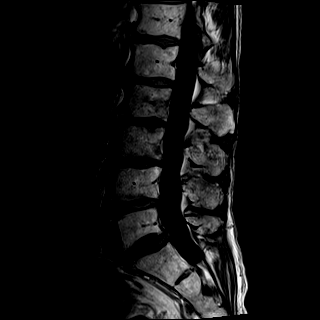
[im 11/17]
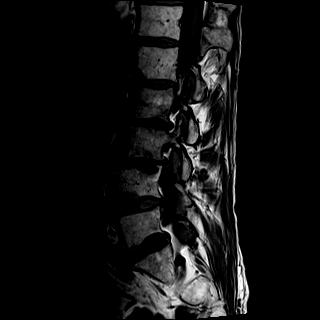
[im 14/17]
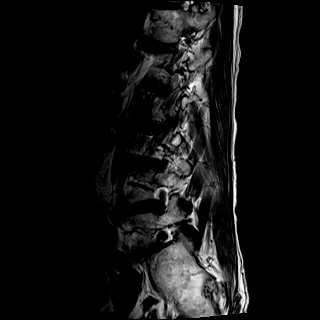
[im 17/17]
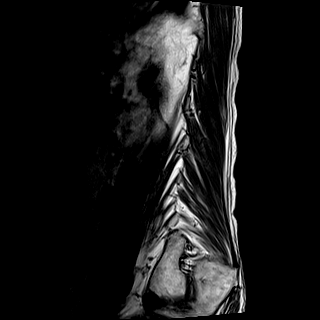

[Series 7: STIR · sagittal · 4.0mm · 0.41mm/px · 1 of 17 slices shown]
[im 1/17]
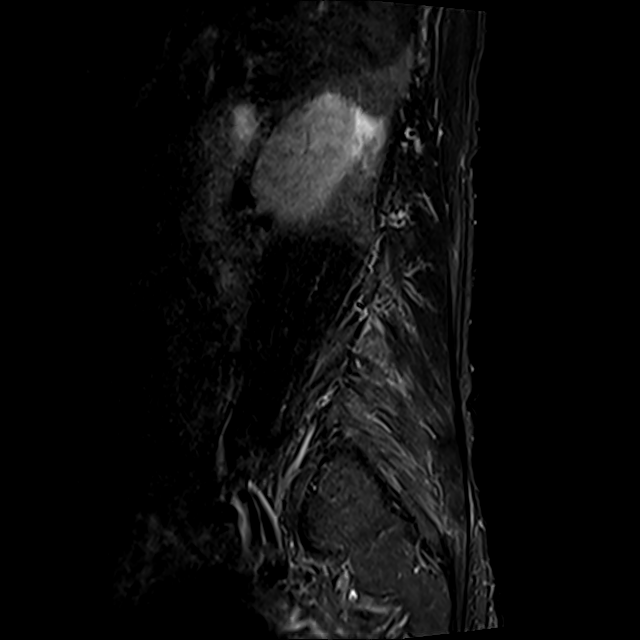

[Series 8: T2 · axial · 4.0mm · 0.78mm/px · z∈[-165,+51]mm · 8 of 36 slices shown (2 of 2)]
[im 1/36]
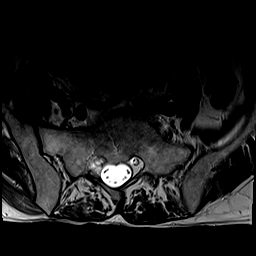
[im 6/36]
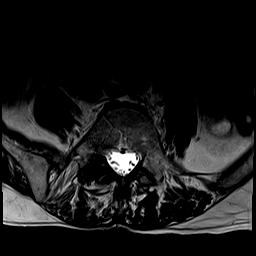
[im 11/36]
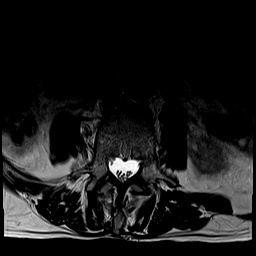
[im 17/36]
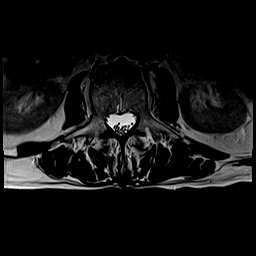
[im 19/36]
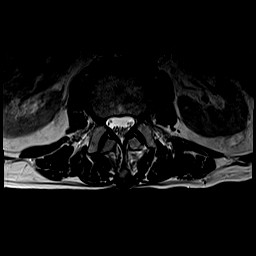
[im 25/36]
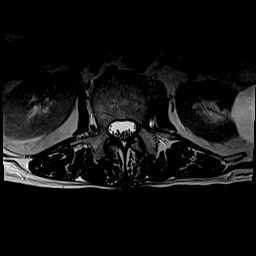
[im 30/36]
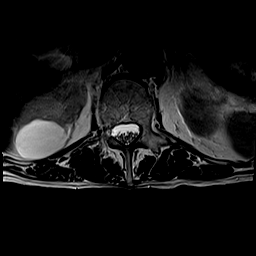
[im 36/36]
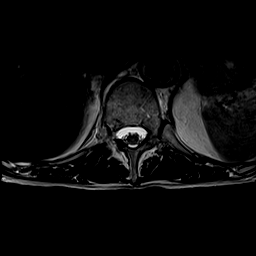

[Series 9: T1 · axial · 4.0mm · 0.39mm/px · z∈[-165,+51]mm · 8 of 36 slices shown (2 of 2)]
[im 1/36]
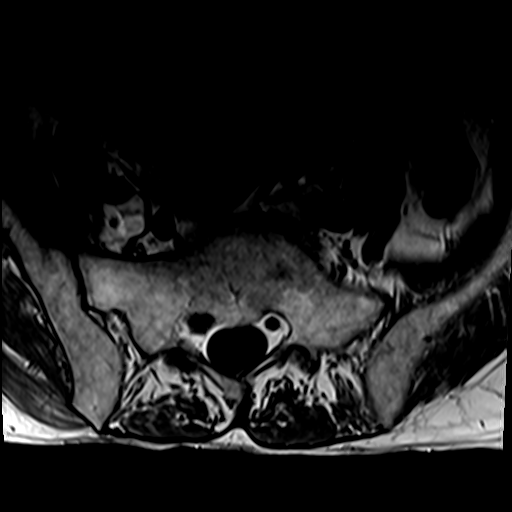
[im 6/36]
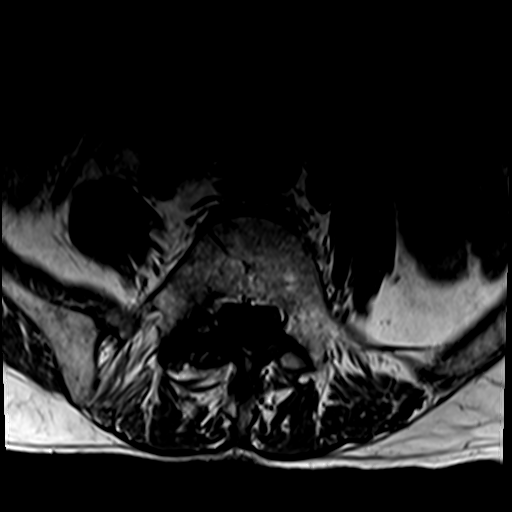
[im 11/36]
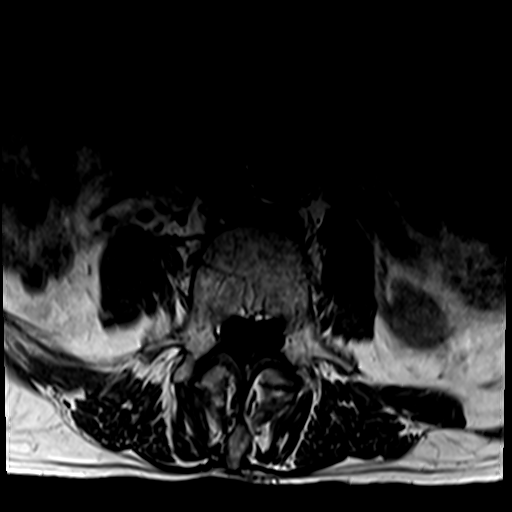
[im 17/36]
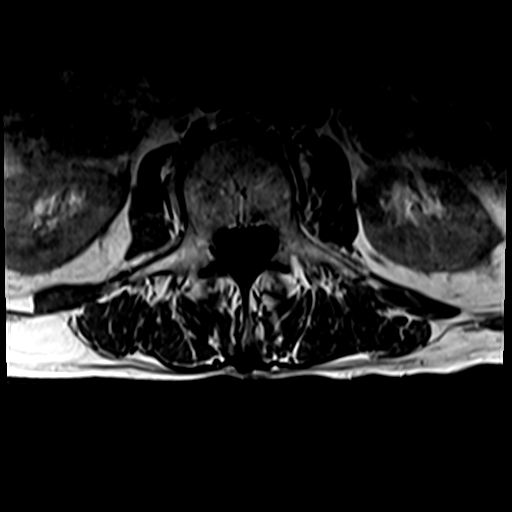
[im 19/36]
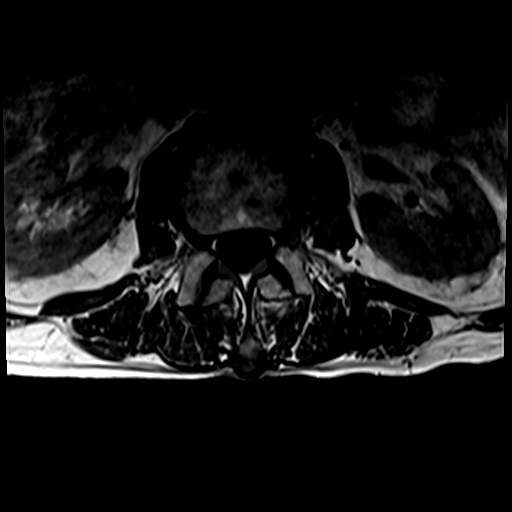
[im 25/36]
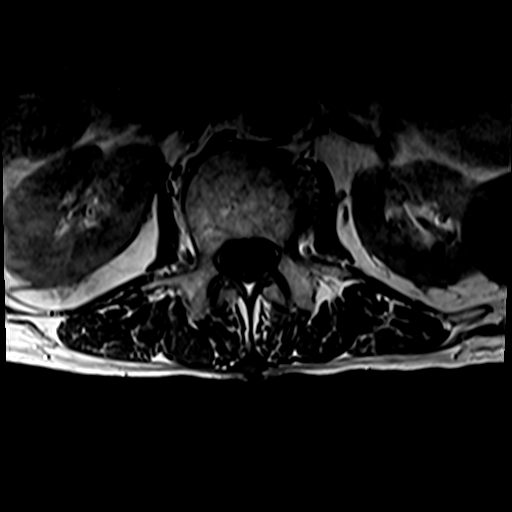
[im 30/36]
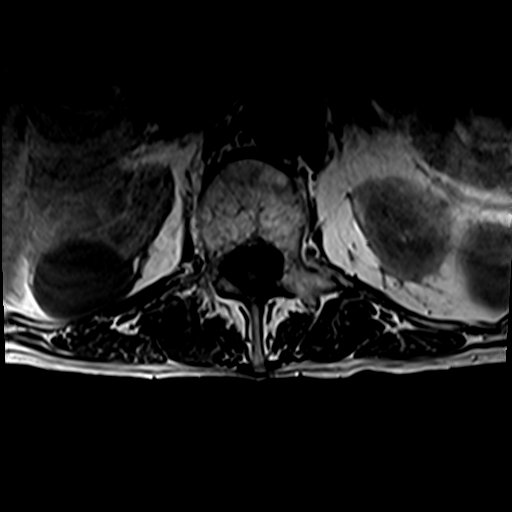
[im 36/36]
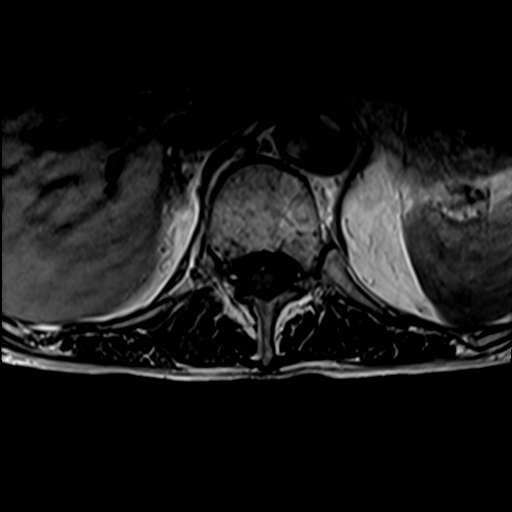

[30 of 48 positions shown; findings below may reference images not displayed]

FINDINGS: Segmentation:  5 lumbar type vertebral bodies assumed.

Alignment: Curvature convex to the right with the apex at L2-3. 2 mm
degenerative anterolisthesis L5-S1.

Vertebrae: No fracture or primary bone lesion. Mild discogenic
endplate edema at L4-5.

Conus medullaris and cauda equina: Conus extends to the L1 level.
Conus and cauda equina appear normal.

Paraspinal and other soft tissues: Aortic atherosclerosis including
an infrarenal abdominal aortic aneurysm with diameter as large as
4.5 cm. Not primarily or completely evaluated.

Disc levels:

T12-L1: Normal

L1-2: Minimal disc bulge.  No stenosis.

L2-3: Moderate disc bulge more prominent towards the left. Mild
stenosis of the left lateral recess and intervertebral foramen on
the left but without visible neural compression.

L3-4: Moderate disc bulge. Mild narrowing of both lateral recesses
and neural foramina without visible neural compression.

L4-5: Moderate bulging of the disc with right foraminal herniation.
Facet degeneration and hypertrophy worse on the right. Right
foraminal stenosis likely to compress the right L4 nerve. Mild left
foraminal stenosis.

L5-S1: Facet osteoarthritis right worse than left with 2 mm of
anterolisthesis. Bulging of the disc. Stenosis of the intervertebral
foramen and lateral recess on the right that could affect the right
L5 nerve. No gross S1 compression is demonstrated.
IMPRESSION: 1. L4-5: Right foraminal disc herniation. Facet degeneration and
hypertrophy worse on the right. Right foraminal stenosis likely to
compress the right L4 nerve.
2. L5-S1: Right-sided predominant facet arthropathy with 2 mm of
anterolisthesis. Bulging of the disc. Stenosis of the intervertebral
foramen and lateral recess on the right that could affect the right
L5 nerve.
3. L2-3 and L3-4: Disc bulges with mild lateral recess and foraminal
narrowing but no distinct neural compression.
4. Aortic atherosclerosis including an infrarenal abdominal aortic
aneurysm with diameter as large as 4.5 cm. Not primarily or
completely evaluated. Recommend follow-up every 6 months and
vascular consultation. This recommendation follows ACR consensus
guidelines: White Paper of the ACR Incidental Findings Committee II
